# Patient Record
Sex: Female | Born: 1990 | Race: White | Hispanic: No
Health system: Southern US, Community
[De-identification: ages and names within clinical notes are randomized; demographics above are authoritative.]

---

## 2019-06-23 ENCOUNTER — Emergency Department (HOSPITAL_COMMUNITY): Payer: Medicaid Other

## 2019-06-23 ENCOUNTER — Emergency Department (HOSPITAL_COMMUNITY)
Admission: EM | Admit: 2019-06-23 | Discharge: 2019-06-23 | Disposition: A | Discharge status: Home / Self Care | Payer: Medicaid Other | Attending: Emergency Medicine | Admitting: Emergency Medicine

## 2019-06-23 ENCOUNTER — Other Ambulatory Visit: Payer: Self-pay

## 2019-06-23 DIAGNOSIS — R112 Nausea with vomiting, unspecified: Secondary | ICD-10-CM

## 2019-06-23 DIAGNOSIS — R109 Unspecified abdominal pain: Secondary | ICD-10-CM

## 2019-06-23 DIAGNOSIS — R1084 Generalized abdominal pain: Secondary | ICD-10-CM | POA: Diagnosis not present

## 2019-06-23 DIAGNOSIS — R310 Gross hematuria: Secondary | ICD-10-CM | POA: Insufficient documentation

## 2019-06-23 LAB — CBC WITH DIFFERENTIAL/PLATELET
Abs Immature Granulocytes: 0.04 10*3/uL (ref 0.00–0.07)
Basophils Absolute: 0.1 10*3/uL (ref 0.0–0.1)
Basophils Relative: 1 %
Eosinophils Absolute: 0 10*3/uL (ref 0.0–0.5)
Eosinophils Relative: 0 %
HCT: 42.2 % (ref 36.0–46.0)
Hemoglobin: 13.5 g/dL (ref 12.0–15.0)
Immature Granulocytes: 0 %
Lymphocytes Relative: 15 %
Lymphs Abs: 1.6 10*3/uL (ref 0.7–4.0)
MCH: 27.9 pg (ref 26.0–34.0)
MCHC: 32 g/dL (ref 30.0–36.0)
MCV: 87.2 fL (ref 80.0–100.0)
Monocytes Absolute: 0.5 10*3/uL (ref 0.1–1.0)
Monocytes Relative: 4 %
Neutro Abs: 8.7 10*3/uL — ABNORMAL HIGH (ref 1.7–7.7)
Neutrophils Relative %: 80 %
Platelets: 253 10*3/uL (ref 150–400)
RBC: 4.84 MIL/uL (ref 3.87–5.11)
RDW: 14 % (ref 11.5–15.5)
WBC: 10.9 10*3/uL — ABNORMAL HIGH (ref 4.0–10.5)
nRBC: 0 % (ref 0.0–0.2)

## 2019-06-23 LAB — URINALYSIS, ROUTINE W REFLEX MICROSCOPIC
Bilirubin Urine: NEGATIVE
Glucose, UA: NEGATIVE mg/dL
Ketones, ur: 5 mg/dL — AB
Nitrite: NEGATIVE
Protein, ur: 100 mg/dL — AB
RBC / HPF: >50 RBC/hpf — ABNORMAL HIGH (ref 0–5)
Specific Gravity, Urine: 1.03 (ref 1.005–1.030)
pH: 5 (ref 5.0–8.0)

## 2019-06-23 LAB — COMPREHENSIVE METABOLIC PANEL
ALT: 19 U/L (ref 0–44)
AST: 17 U/L (ref 15–41)
Albumin: 3.8 g/dL (ref 3.5–5.0)
Alkaline Phosphatase: 92 U/L (ref 38–126)
Anion gap: 7 (ref 5–15)
BUN: 9 mg/dL (ref 6–20)
CO2: 22 mmol/L (ref 22–32)
Calcium: 9 mg/dL (ref 8.9–10.3)
Chloride: 107 mmol/L (ref 98–111)
Creatinine, Ser: 0.77 mg/dL (ref 0.44–1.00)
GFR calc Af Amer: >60 mL/min (ref >60)
GFR calc non Af Amer: >60 mL/min (ref >60)
Glucose, Bld: 118 mg/dL — ABNORMAL HIGH (ref 70–99)
Potassium: 3.8 mmol/L (ref 3.5–5.1)
Sodium: 136 mmol/L (ref 135–145)
Total Bilirubin: 0.5 mg/dL (ref 0.3–1.2)
Total Protein: 7.4 g/dL (ref 6.5–8.1)

## 2019-06-23 LAB — LIPASE, BLOOD: Lipase: 18 U/L (ref 11–51)

## 2019-06-23 LAB — I-STAT BETA HCG BLOOD, ED (MC, WL, AP ONLY): I-stat hCG, quantitative: <5 m[IU]/mL (ref <5)

## 2019-06-23 MED ORDER — KETOROLAC TROMETHAMINE 30 MG/ML IJ SOLN
30.0000 mg | Freq: Once | INTRAMUSCULAR | Status: AC
  Administered 2019-06-23: 30 mg via INTRAVENOUS
  Filled 2019-06-23: qty 1

## 2019-06-23 MED ORDER — MORPHINE SULFATE (PF) 4 MG/ML IV SOLN
4.0000 mg | Freq: Once | INTRAVENOUS | Status: AC
  Administered 2019-06-23: 4 mg via INTRAVENOUS
  Filled 2019-06-23: qty 1

## 2019-06-23 MED ORDER — ONDANSETRON HCL 4 MG/2ML IJ SOLN
4.0000 mg | Freq: Once | INTRAMUSCULAR | Status: AC
  Administered 2019-06-23: 15:00:00 4 mg via INTRAVENOUS
  Filled 2019-06-23: qty 2

## 2019-06-23 MED ORDER — SODIUM CHLORIDE (PF) 0.9 % IJ SOLN
INTRAMUSCULAR | Status: AC
  Filled 2019-06-23: qty 50

## 2019-06-23 MED ORDER — ONDANSETRON 4 MG PO TBDP: 4.0000 mg | ORAL_TABLET | Freq: Three times a day (TID) | ORAL | 0 refills | Status: AC | PRN

## 2019-06-23 MED ORDER — IBUPROFEN 600 MG PO TABS: 600.0000 mg | ORAL_TABLET | Freq: Four times a day (QID) | ORAL | 0 refills | Status: AC | PRN

## 2019-06-23 MED ORDER — SODIUM CHLORIDE 0.9 % IV BOLUS
1000.0000 mL | Freq: Once | INTRAVENOUS | Status: AC
  Administered 2019-06-23: 15:00:00 1000 mL via INTRAVENOUS

## 2019-06-23 MED ORDER — HYDROCODONE-ACETAMINOPHEN 5-325 MG PO TABS: 1.0000 | ORAL_TABLET | Freq: Four times a day (QID) | ORAL | 0 refills | Status: AC | PRN

## 2019-06-23 MED ORDER — IOHEXOL 300 MG/ML  SOLN
100.0000 mL | Freq: Once | INTRAMUSCULAR | Status: AC | PRN
  Administered 2019-06-23: 100 mL via INTRAVENOUS

## 2019-06-23 NOTE — ED Triage Notes (Signed)
Arrived via [facility] from public location in car CC severe flank pain sudden onset today >3 hours ago.  Pt denies injury, urinary changes or relief with OTC  meds. Pt had 1 episode of emesis with EMS.    VSS

## 2019-06-23 NOTE — ED Notes (Signed)
This RN called CT to see when exam will take place

## 2019-06-23 NOTE — ED Notes (Signed)
Pt verbalizes understanding of DC instructions. Pt belongings returned and is ambulatory out of ED.  

## 2019-06-23 NOTE — Discharge Instructions (Signed)
1. Medications: Alternate 600 mg of ibuprofen and [phone] mg of Tylenol every 3 hours as needed for pain. Do not exceed 4000 mg of Tylenol daily.  Take ibuprofen with food to avoid upset stomach issues.  Take Zofran as needed for nausea.  Let this medicine dissolve under your tongue and wait around 10-15 minutes before eating or drinking after taking this medication to give it time to work.  You can take hydrocodone as needed for severe pain but do not drive, drink alcohol, operate heavy machinery while taking this medicine as it can cause drowsiness.  Be aware this medicine also contains Tylenol. 2. Treatment: rest, drink plenty of fluids. Can strain urine to see if you can capture kidney stone.  3. Follow Up: Please followup with urology as soon as possible (preferably this week) for discussion of your diagnoses and further evaluation after today's visit; Please return to the ER for persistent vomiting, high fevers or worsening symptoms

## 2019-06-23 NOTE — ED Notes (Signed)
Pt transported to CT ?

## 2019-06-23 NOTE — ED Provider Notes (Signed)
[facility]-EMERGENCY DEPT Provider Note   CSN: [id number] Arrival date & time: 06/23/19  1413     History Chief Complaint  Patient presents with  . Flank Pain    Suzanne Weiss is a 29 y.o. female with no significant past medical history presents for evaluation of acute onset, progressively worsening right flank pain began just prior to arrival.  She reports that she was sitting watching television with her husband and 9-month-old daughter when she developed severe sharp right flank pain radiating down into the lower abdomen.  She has had 1 episode of nonbloody nonbilious emesis.  She states she felt as though she could not get comfortable.  She thinks the pain may worsen with certain position changes. Has had some urinary urgency.  She has had a couple episodes of softer nonbloody bowel movements today.  Has not tried anything for her symptoms.  Has never had pain like this before.  She denies fevers, chills, chest pain, shortness of breath, or cough.   The history is provided by the patient.       No past medical history on file.  There are no problems to display for this patient.   OB History   No obstetric history on file.     No family history on file.  Social History   Tobacco Use  . Smoking status: Not on file  Substance Use Topics  . Alcohol use: Not on file  . Drug use: Not on file    Home Medications Prior to Admission medications   Medication Sig Start Date End Date Taking? Authorizing Provider  HYDROcodone-acetaminophen (NORCO/VICODIN) 5-325 MG tablet Take 1 tablet by mouth every 6 (six) hours as needed for severe pain. 06/23/19   Amayrani Bennick A, PA-C  ibuprofen (ADVIL) 600 MG tablet Take 1 tablet (600 mg total) by mouth every 6 (six) hours as needed. 06/23/19   Luevenia Maxin, Marita Burnsed A, PA-C  ondansetron (ZOFRAN ODT) 4 MG disintegrating tablet Take 1 tablet (4 mg total) by mouth every 8 (eight) hours as needed for nausea or vomiting. 06/23/19   Jeanie Sewer, PA-C    Allergies    Patient has no allergy information on record.  Review of Systems   Review of Systems  Constitutional: Negative for chills and fever.  Respiratory: Negative for cough and shortness of breath.   Cardiovascular: Negative for chest pain.  Gastrointestinal: Positive for abdominal pain, diarrhea, nausea and vomiting.  Genitourinary: Positive for flank pain and urgency. Negative for vaginal bleeding, vaginal discharge and vaginal pain.  All other systems reviewed and are negative.   Physical Exam Updated Vital Signs BP 120/79   Pulse 87   Temp 98.9 F (37.2 C) (Oral)   Resp 18   Ht 5\' 5"  (1.651 m)   Wt 97.5 kg   SpO2 99%   BMI 35.78 kg/m   Physical Exam Vitals and nursing note reviewed.  Constitutional:      General: She is not in acute distress.    Appearance: She is well-developed.     Comments: Appears quite uncomfortable  HENT:     Head: Normocephalic and atraumatic.  Eyes:     General:        Right eye: No discharge.        Left eye: No discharge.     Conjunctiva/sclera: Conjunctivae normal.  Neck:     Vascular: No JVD.     Trachea: No tracheal deviation.  Cardiovascular:     Rate and  Rhythm: Normal rate.  Pulmonary:     Effort: Pulmonary effort is normal.  Abdominal:     General: There is no distension.     Palpations: Abdomen is soft.     Tenderness: There is abdominal tenderness in the right upper quadrant, right lower quadrant, epigastric area, periumbilical area and suprapubic area. There is right CVA tenderness. There is no rebound. Positive signs include psoas sign. Negative signs include Murphy's sign, Rovsing's sign, McBurney's sign and obturator sign.  Musculoskeletal:     Cervical back: Neck supple.  Skin:    Findings: No erythema.  Neurological:     Mental Status: She is alert.  Psychiatric:        Behavior: Behavior normal.     ED Results / Procedures / Treatments   Labs (all labs ordered are listed, but only  abnormal results are displayed) Labs Reviewed  URINE CULTURE - Abnormal; Notable for the following components:      Result Value   Culture   (*)    Value: <10,000 COLONIES/mL INSIGNIFICANT GROWTH Performed at [facility], [address] [place], [place] [address]    All other components within normal limits  COMPREHENSIVE METABOLIC PANEL - Abnormal; Notable for the following components:   Glucose, Bld 118 (*)    All other components within normal limits  CBC WITH DIFFERENTIAL/PLATELET - Abnormal; Notable for the following components:   WBC 10.9 (*)    Neutro Abs 8.7 (*)    All other components within normal limits  URINALYSIS, ROUTINE W REFLEX MICROSCOPIC - Abnormal; Notable for the following components:   Color, Urine AMBER (*)    Hgb urine dipstick LARGE (*)    Ketones, ur 5 (*)    Protein, ur 100 (*)    Leukocytes,Ua TRACE (*)    RBC / HPF >50 (*)    Bacteria, UA RARE (*)    All other components within normal limits  LIPASE, BLOOD  I-STAT BETA HCG BLOOD, ED ([facility], [facility], [facility] ONLY)    EKG None  Radiology CT ABDOMEN PELVIS W CONTRAST  Result Date: 06/23/2019 CLINICAL DATA:  Severe flank pain. Appendicitis versus nephrolithiasis. EXAM: CT ABDOMEN AND PELVIS WITH CONTRAST TECHNIQUE: Multidetector CT imaging of the abdomen and pelvis was performed using the standard protocol following bolus administration of intravenous contrast. CONTRAST:  179mL OMNIPAQUE IOHEXOL 300 MG/ML  SOLN COMPARISON:  None. FINDINGS: Lower chest:  No contributory findings. Hepatobiliary: No focal liver abnormality.No evidence of biliary obstruction or stone. Pancreas: Unremarkable. Spleen: Unremarkable. Adrenals/Urinary Tract: Negative adrenals. No hydronephrosis or stone. Unremarkable bladder. Stomach/Bowel:  No obstruction. No appendicitis. Vascular/Lymphatic: No acute vascular abnormality. No mass or adenopathy. Reproductive:No pathologic findings. Other: No ascites or pneumoperitoneum. Musculoskeletal:  No acute abnormalities. IMPRESSION: Negative.  No explanation for abdominal pain. Electronically Signed   By: Monte Fantasia M.D.   On: 06/23/2019 18:02    Procedures Procedures (including critical care time)  Medications Ordered in ED Medications  sodium chloride 0.9 % bolus 1,000 mL (0 mLs Intravenous Stopped 06/23/19 1643)  ondansetron (ZOFRAN) injection 4 mg (4 mg Intravenous Given 06/23/19 1518)  morphine 4 MG/ML injection 4 mg (4 mg Intravenous Given 06/23/19 1518)  iohexol (OMNIPAQUE) 300 MG/ML solution 100 mL (100 mLs Intravenous Contrast Given 06/23/19 1739)  ketorolac (TORADOL) 30 MG/ML injection 30 mg (30 mg Intravenous Given 06/23/19 1846)    ED Course  I have reviewed the triage vital signs and the nursing notes.  Pertinent labs & imaging results that  were available during my care of the patient were reviewed by me and considered in my medical decision making (see chart for details).    MDM Rules/Calculators/A&P                      Patient presenting for evaluation of sudden onset right flank pain radiating to the lower abdomen with associated urinary symptoms, nausea, and vomiting.  She is afebrile, vital signs are stable.  She is nontoxic in appearance.  No peritoneal signs on examination of the abdomen.  She appears quite uncomfortable, will give IV fluids, pain medicine and antiemetics.  Lab work reviewed by me shows mild leukocytosis, no anemia, no metabolic derangements, no renal insufficiency.  Her UA is concerning for dehydration and nephrolithiasis with greater than 50 RBCs.  She has rare bacteria.  I am more concerned with kidney stone than a pyelonephritis at this time given the sudden onset of her symptoms.  She underwent CT abdomen and pelvis with contrast which shows no definite explanation for patient's abdominal pain.  No evidence of acute surgical abdominal pathology including obstruction, perforation, appendicitis, or cholecystitis.  I suspect that the patient  either passed a kidney stone or is passing a kidney stone given her presentation.  As the study contained contrast it could have masked a tiny nonobstructing stone in the ureter.  I have a low suspicion of PID, TOA, ovarian torsion or ectopic pregnancy.  She is not currently menstruating. On reevaluation the patient is resting more comfortably.  Reports she feels better.  Serial abdominal examinations remain benign and she is tolerating p.o. fluids without difficulty.  Will discharge home with Zofran, small amount of pain medications and discussed appropriate use of these medications.  Recommend follow-up with PCP or urology on an outpatient basis for reevaluation.  Discussed strict ED return precautions.  Patient verbalized understanding of and agreement with plan and patient is stable for discharge home at this time. Final Clinical Impression(s) / ED Diagnoses Final diagnoses:  Right flank pain  Gross hematuria  Non-intractable vomiting with nausea, unspecified vomiting type    Rx / DC Orders ED Discharge Orders         Ordered    ondansetron (ZOFRAN ODT) 4 MG disintegrating tablet  Every 8 hours PRN     06/23/19 1959    ibuprofen (ADVIL) 600 MG tablet  Every 6 hours PRN     06/23/19 1959    HYDROcodone-acetaminophen (NORCO/VICODIN) 5-325 MG tablet  Every 6 hours PRN     06/23/19 1959           Jeanie Sewer, PA-C 06/25/19 1412    Raeford Razor, MD 06/29/19 [phone]

## 2019-06-24 LAB — URINE CULTURE: Culture: <10000 — AB

## 2021-07-30 IMAGING — CT CT ABD-PELV W/ CM
2 of 4 series · 16 of 46 positions shown, 18 images · IV contrast (omnipaque)
Comparison: None.

CLINICAL DATA: Severe flank pain. Appendicitis versus
nephrolithiasis.

EXAM:
CT ABDOMEN AND PELVIS WITH CONTRAST
TECHNIQUE: Multidetector CT imaging of the abdomen and pelvis was performed
using the standard protocol following bolus administration of
intravenous contrast.
CONTRAST:  100mL OMNIPAQUE IOHEXOL 300 MG/ML  SOLN

[Series 2: axial st · axial · 0.76mm/px · z∈[+1291,+1731]mm · 13 of 100 slices shown, 15 images]
[im 6/100  soft-tissue]
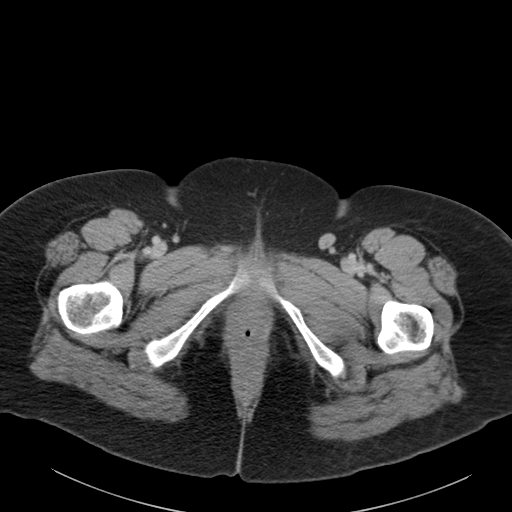
[im 6/100  bone]
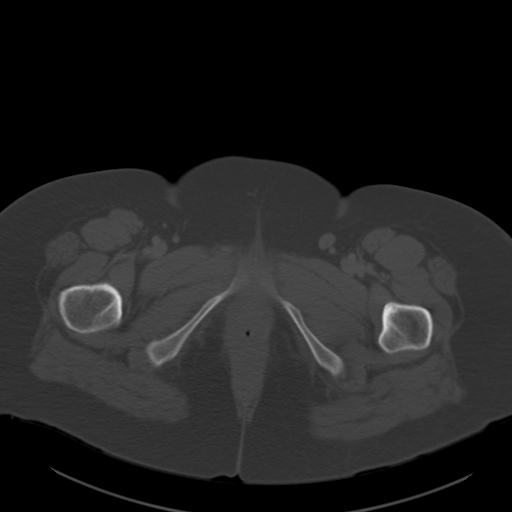
[im 12/100  soft-tissue]
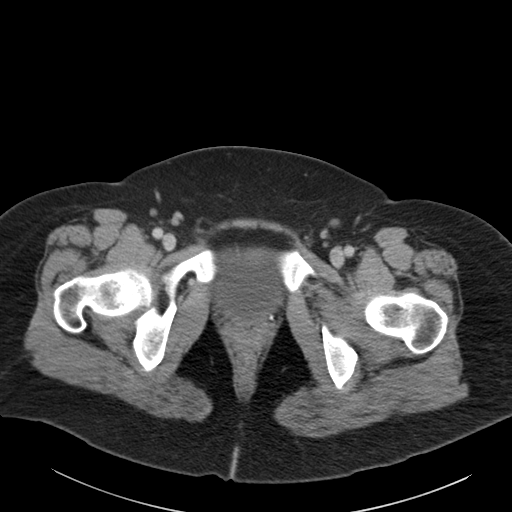
[im 24/100  soft-tissue]
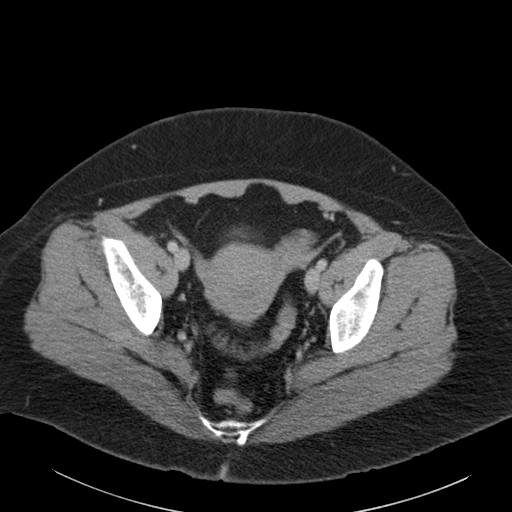
[im 30/100  soft-tissue]
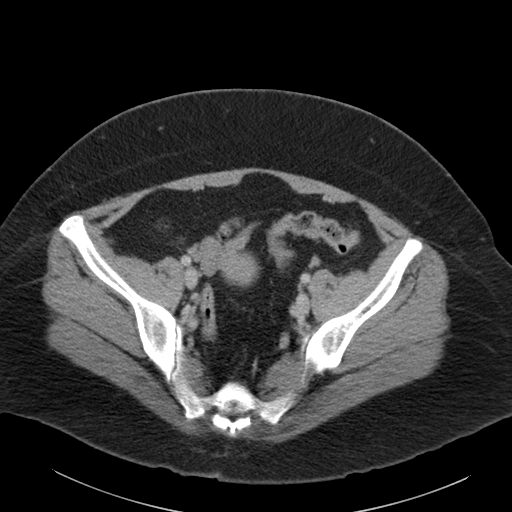
[im 35/100  soft-tissue]
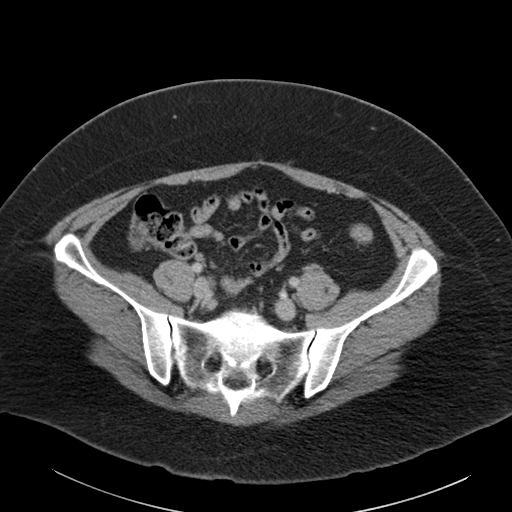
[im 41/100  soft-tissue]
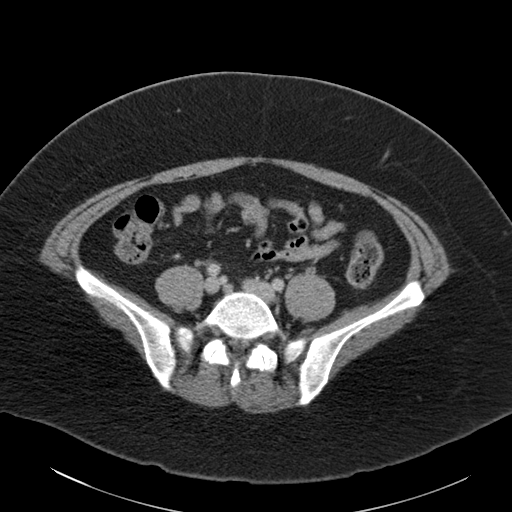
[im 53/100  soft-tissue]
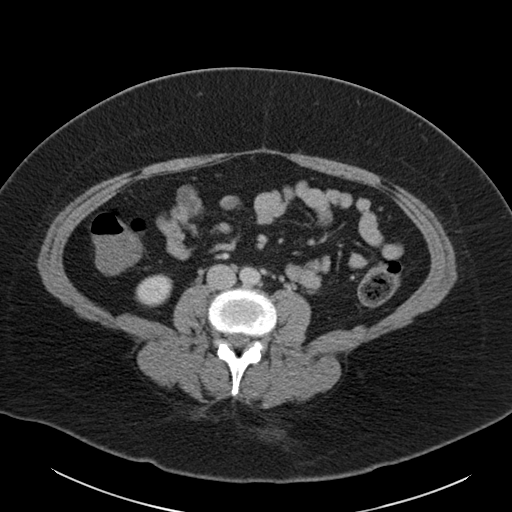
[im 59/100  soft-tissue]
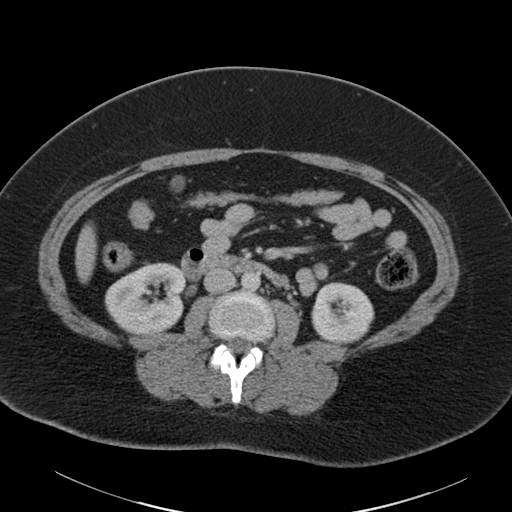
[im 65/100  soft-tissue]
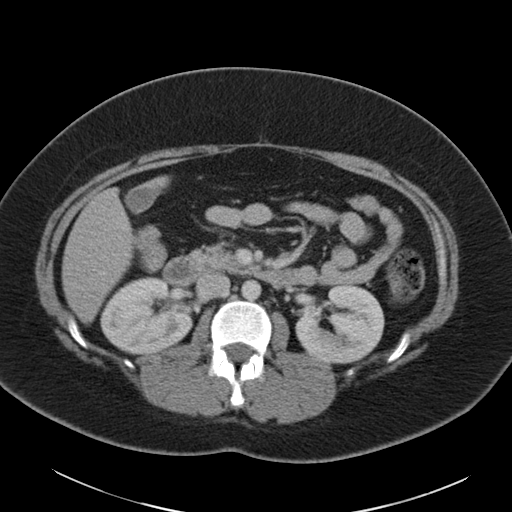
[im 65/100  bone]
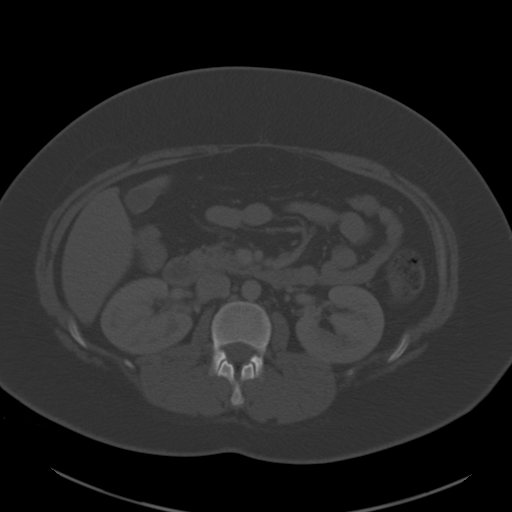
[im 70/100  soft-tissue]
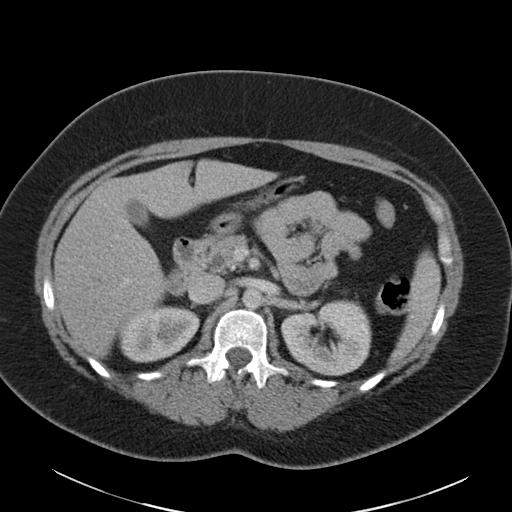
[im 76/100  soft-tissue]
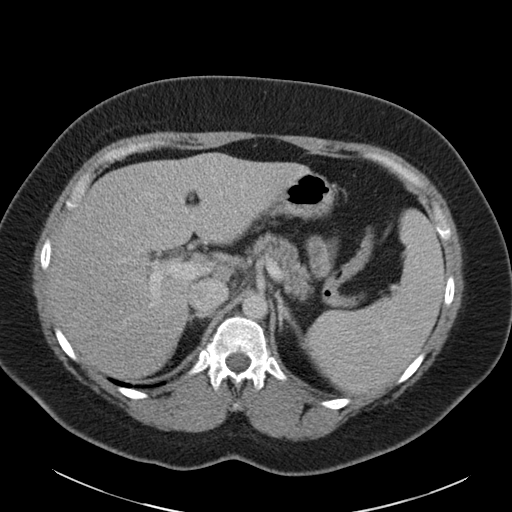
[im 88/100  soft-tissue]
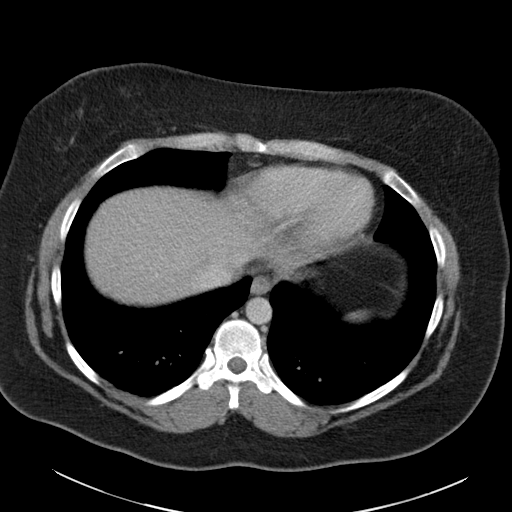
[im 94/100  soft-tissue]
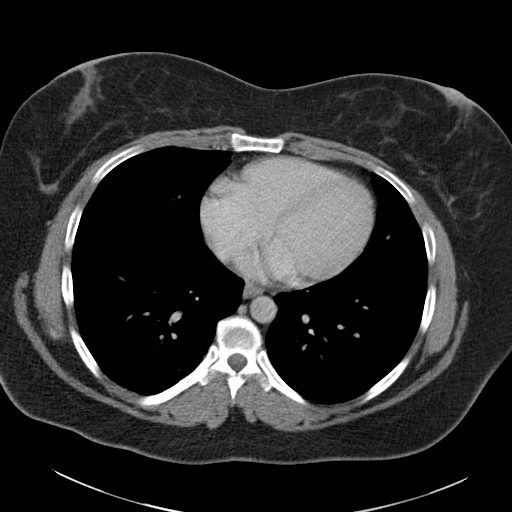

[Series 5: coronal st · coronal · 0.68mm/px · 3 of 124 slices shown]
[im 42/124  soft-tissue]
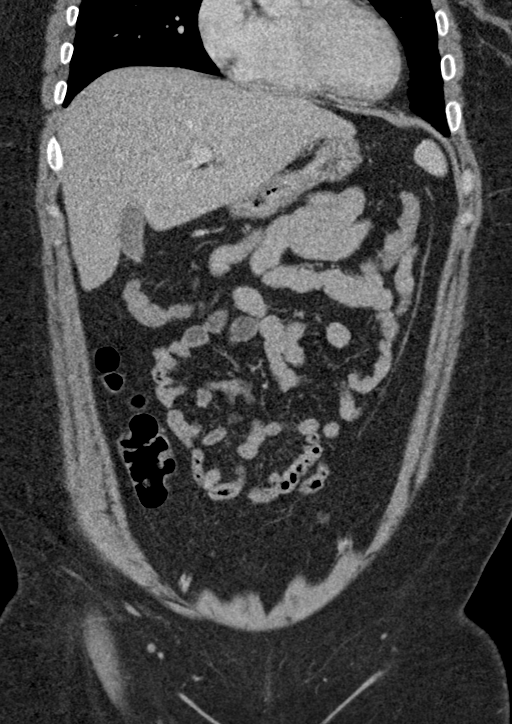
[im 55/124  soft-tissue]
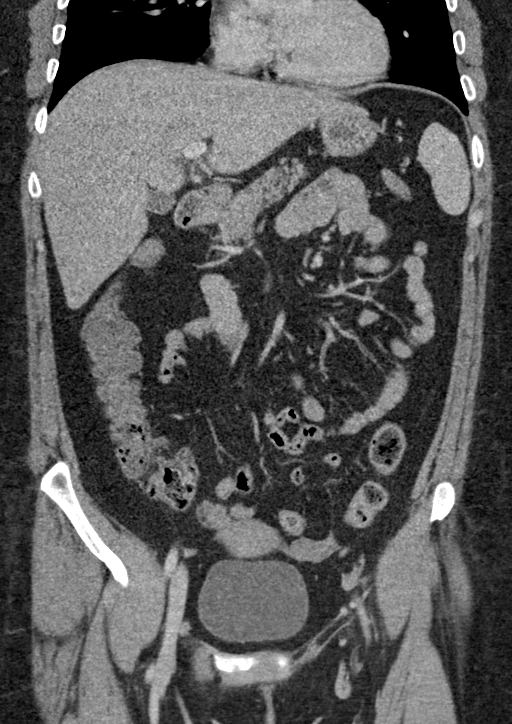
[im 69/124  soft-tissue]
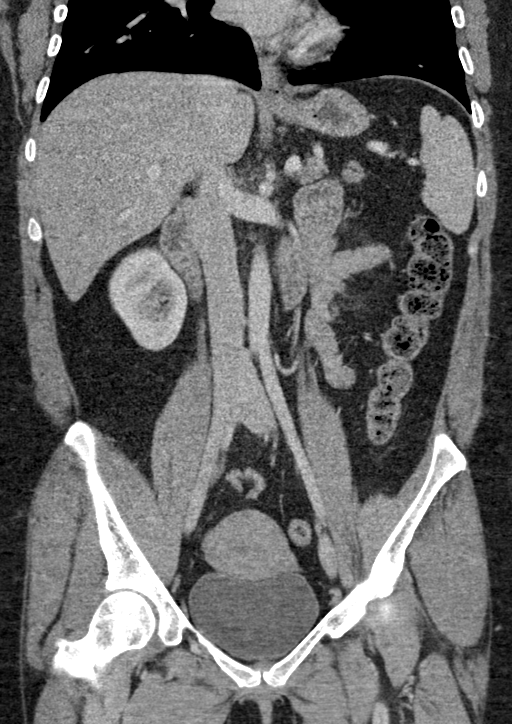

[16 of 46 positions shown; findings below may reference images not displayed]

FINDINGS: Lower chest:  No contributory findings.

Hepatobiliary: No focal liver abnormality.No evidence of biliary
obstruction or stone.

Pancreas: Unremarkable.

Spleen: Unremarkable.

Adrenals/Urinary Tract: Negative adrenals. No hydronephrosis or
stone. Unremarkable bladder.

Stomach/Bowel:  No obstruction. No appendicitis.

Vascular/Lymphatic: No acute vascular abnormality. No mass or
adenopathy.

Reproductive:No pathologic findings.

Other: No ascites or pneumoperitoneum.

Musculoskeletal: No acute abnormalities.
IMPRESSION: Negative.  No explanation for abdominal pain.
